# Patient Record
Sex: Female | Born: 1965 | Race: Black or African American | Hispanic: No | Marital: Single | Smoking: Former smoker
Health system: Southern US, Community
[De-identification: ages and names within clinical notes are randomized; demographics above are authoritative.]

## PROBLEM LIST (undated history)

## (undated) DIAGNOSIS — I10 Essential (primary) hypertension: Secondary | ICD-10-CM

## (undated) DIAGNOSIS — E785 Hyperlipidemia, unspecified: Secondary | ICD-10-CM

## (undated) DIAGNOSIS — E119 Type 2 diabetes mellitus without complications: Secondary | ICD-10-CM

## (undated) HISTORY — DX: Hyperlipidemia, unspecified: E78.5

## (undated) HISTORY — DX: Essential (primary) hypertension: I10

## (undated) HISTORY — DX: Type 2 diabetes mellitus without complications: E11.9

---

## 2012-03-17 ENCOUNTER — Ambulatory Visit
Admission: RE | Admit: 2012-03-17 | Discharge: 2012-03-17 | Disposition: A | Discharge status: Home / Self Care | Payer: No Typology Code available for payment source | Source: Ambulatory Visit | Attending: Physical Medicine and Rehabilitation | Admitting: Physical Medicine and Rehabilitation

## 2012-03-17 ENCOUNTER — Other Ambulatory Visit: Payer: Self-pay | Admitting: Physical Medicine and Rehabilitation

## 2012-03-17 DIAGNOSIS — Z021 Encounter for pre-employment examination: Secondary | ICD-10-CM

## 2013-02-15 ENCOUNTER — Other Ambulatory Visit (HOSPITAL_COMMUNITY): Payer: Self-pay | Admitting: Internal Medicine

## 2013-02-15 DIAGNOSIS — Z139 Encounter for screening, unspecified: Secondary | ICD-10-CM

## 2013-02-17 ENCOUNTER — Ambulatory Visit (HOSPITAL_COMMUNITY)
Admission: RE | Admit: 2013-02-17 | Discharge: 2013-02-17 | Disposition: A | Discharge status: Home / Self Care | Payer: BC Managed Care – PPO | Source: Ambulatory Visit | Attending: Internal Medicine | Admitting: Internal Medicine

## 2013-02-17 DIAGNOSIS — Z139 Encounter for screening, unspecified: Secondary | ICD-10-CM

## 2013-02-17 DIAGNOSIS — Z1231 Encounter for screening mammogram for malignant neoplasm of breast: Secondary | ICD-10-CM | POA: Insufficient documentation

## 2014-05-08 ENCOUNTER — Other Ambulatory Visit (HOSPITAL_COMMUNITY): Payer: Self-pay | Admitting: Internal Medicine

## 2014-05-08 DIAGNOSIS — Z139 Encounter for screening, unspecified: Secondary | ICD-10-CM

## 2014-05-15 ENCOUNTER — Ambulatory Visit (HOSPITAL_COMMUNITY)
Admission: RE | Admit: 2014-05-15 | Discharge: 2014-05-15 | Disposition: A | Discharge status: Home / Self Care | Payer: 59 | Source: Ambulatory Visit | Attending: Internal Medicine | Admitting: Internal Medicine

## 2014-05-15 DIAGNOSIS — Z139 Encounter for screening, unspecified: Secondary | ICD-10-CM

## 2014-05-15 DIAGNOSIS — Z1231 Encounter for screening mammogram for malignant neoplasm of breast: Secondary | ICD-10-CM | POA: Diagnosis not present

## 2015-05-30 ENCOUNTER — Other Ambulatory Visit (HOSPITAL_COMMUNITY): Payer: Self-pay | Admitting: Internal Medicine

## 2015-05-30 DIAGNOSIS — Z1231 Encounter for screening mammogram for malignant neoplasm of breast: Secondary | ICD-10-CM

## 2015-06-21 ENCOUNTER — Ambulatory Visit (HOSPITAL_COMMUNITY)
Admission: RE | Admit: 2015-06-21 | Discharge: 2015-06-21 | Disposition: A | Discharge status: Home / Self Care | Payer: 59 | Source: Ambulatory Visit | Attending: Internal Medicine | Admitting: Internal Medicine

## 2015-06-21 DIAGNOSIS — Z1231 Encounter for screening mammogram for malignant neoplasm of breast: Secondary | ICD-10-CM | POA: Diagnosis not present

## 2016-06-10 ENCOUNTER — Other Ambulatory Visit (HOSPITAL_COMMUNITY): Payer: Self-pay | Admitting: Internal Medicine

## 2016-06-10 DIAGNOSIS — Z1231 Encounter for screening mammogram for malignant neoplasm of breast: Secondary | ICD-10-CM

## 2016-07-09 ENCOUNTER — Ambulatory Visit (HOSPITAL_COMMUNITY)
Admission: RE | Admit: 2016-07-09 | Discharge: 2016-07-09 | Disposition: A | Discharge status: Home / Self Care | Payer: 59 | Source: Ambulatory Visit | Attending: Internal Medicine | Admitting: Internal Medicine

## 2016-07-09 DIAGNOSIS — Z1231 Encounter for screening mammogram for malignant neoplasm of breast: Secondary | ICD-10-CM | POA: Insufficient documentation

## 2017-06-09 ENCOUNTER — Other Ambulatory Visit (HOSPITAL_COMMUNITY): Payer: Self-pay | Admitting: Internal Medicine

## 2017-06-09 DIAGNOSIS — Z1231 Encounter for screening mammogram for malignant neoplasm of breast: Secondary | ICD-10-CM

## 2017-07-16 ENCOUNTER — Encounter (HOSPITAL_COMMUNITY): Payer: Self-pay

## 2017-07-16 ENCOUNTER — Ambulatory Visit (HOSPITAL_COMMUNITY)
Admission: RE | Admit: 2017-07-16 | Discharge: 2017-07-16 | Disposition: A | Discharge status: Home / Self Care | Payer: 59 | Source: Ambulatory Visit | Attending: Internal Medicine | Admitting: Internal Medicine

## 2017-07-16 DIAGNOSIS — Z1231 Encounter for screening mammogram for malignant neoplasm of breast: Secondary | ICD-10-CM

## 2018-06-15 ENCOUNTER — Other Ambulatory Visit (HOSPITAL_COMMUNITY): Payer: Self-pay | Admitting: Internal Medicine

## 2018-06-15 DIAGNOSIS — Z1231 Encounter for screening mammogram for malignant neoplasm of breast: Secondary | ICD-10-CM

## 2018-06-17 DIAGNOSIS — Z124 Encounter for screening for malignant neoplasm of cervix: Secondary | ICD-10-CM | POA: Diagnosis not present

## 2018-06-17 DIAGNOSIS — L853 Xerosis cutis: Secondary | ICD-10-CM | POA: Diagnosis not present

## 2018-06-17 DIAGNOSIS — Z0000 Encounter for general adult medical examination without abnormal findings: Secondary | ICD-10-CM | POA: Diagnosis not present

## 2018-06-17 DIAGNOSIS — I10 Essential (primary) hypertension: Secondary | ICD-10-CM | POA: Diagnosis not present

## 2018-06-17 DIAGNOSIS — Z0001 Encounter for general adult medical examination with abnormal findings: Secondary | ICD-10-CM | POA: Diagnosis not present

## 2018-06-24 DIAGNOSIS — E781 Pure hyperglyceridemia: Secondary | ICD-10-CM | POA: Diagnosis not present

## 2018-06-24 DIAGNOSIS — L853 Xerosis cutis: Secondary | ICD-10-CM | POA: Diagnosis not present

## 2018-06-24 DIAGNOSIS — R7301 Impaired fasting glucose: Secondary | ICD-10-CM | POA: Diagnosis not present

## 2018-07-23 ENCOUNTER — Ambulatory Visit (HOSPITAL_COMMUNITY): Payer: 59

## 2018-07-28 ENCOUNTER — Other Ambulatory Visit (HOSPITAL_COMMUNITY): Payer: Self-pay | Admitting: Internal Medicine

## 2018-07-28 ENCOUNTER — Encounter (HOSPITAL_COMMUNITY): Payer: Self-pay | Admitting: Radiology

## 2018-07-28 ENCOUNTER — Ambulatory Visit (HOSPITAL_COMMUNITY)
Admission: RE | Admit: 2018-07-28 | Discharge: 2018-07-28 | Disposition: A | Discharge status: Home / Self Care | Payer: 59 | Source: Ambulatory Visit | Attending: Internal Medicine | Admitting: Internal Medicine

## 2018-07-28 DIAGNOSIS — Z1231 Encounter for screening mammogram for malignant neoplasm of breast: Secondary | ICD-10-CM

## 2018-07-28 DIAGNOSIS — R928 Other abnormal and inconclusive findings on diagnostic imaging of breast: Secondary | ICD-10-CM

## 2018-08-03 ENCOUNTER — Ambulatory Visit (HOSPITAL_COMMUNITY)
Admission: RE | Admit: 2018-08-03 | Discharge: 2018-08-03 | Disposition: A | Discharge status: Home / Self Care | Payer: 59 | Source: Ambulatory Visit | Attending: Internal Medicine | Admitting: Internal Medicine

## 2018-08-03 DIAGNOSIS — R922 Inconclusive mammogram: Secondary | ICD-10-CM | POA: Diagnosis not present

## 2018-08-03 DIAGNOSIS — N6012 Diffuse cystic mastopathy of left breast: Secondary | ICD-10-CM | POA: Diagnosis not present

## 2018-08-03 DIAGNOSIS — R928 Other abnormal and inconclusive findings on diagnostic imaging of breast: Secondary | ICD-10-CM | POA: Diagnosis not present

## 2018-12-17 DIAGNOSIS — R7301 Impaired fasting glucose: Secondary | ICD-10-CM | POA: Diagnosis not present

## 2018-12-17 DIAGNOSIS — I10 Essential (primary) hypertension: Secondary | ICD-10-CM | POA: Diagnosis not present

## 2018-12-23 DIAGNOSIS — I10 Essential (primary) hypertension: Secondary | ICD-10-CM | POA: Diagnosis not present

## 2018-12-23 DIAGNOSIS — R7301 Impaired fasting glucose: Secondary | ICD-10-CM | POA: Diagnosis not present

## 2018-12-23 DIAGNOSIS — E782 Mixed hyperlipidemia: Secondary | ICD-10-CM | POA: Diagnosis not present

## 2019-09-13 ENCOUNTER — Other Ambulatory Visit (HOSPITAL_COMMUNITY): Payer: Self-pay | Admitting: Internal Medicine

## 2019-09-13 DIAGNOSIS — Z1231 Encounter for screening mammogram for malignant neoplasm of breast: Secondary | ICD-10-CM

## 2019-09-26 ENCOUNTER — Other Ambulatory Visit: Payer: Self-pay

## 2019-09-26 ENCOUNTER — Ambulatory Visit (HOSPITAL_COMMUNITY)
Admission: RE | Admit: 2019-09-26 | Discharge: 2019-09-26 | Disposition: A | Discharge status: Home / Self Care | Payer: 59 | Source: Ambulatory Visit | Attending: Internal Medicine | Admitting: Internal Medicine

## 2019-09-26 DIAGNOSIS — Z1231 Encounter for screening mammogram for malignant neoplasm of breast: Secondary | ICD-10-CM

## 2020-07-11 IMAGING — MG DIGITAL SCREENING BILAT W/ TOMO W/ CAD
5 series · 6 of 17 positions shown · non-contrast
Comparison: Previous exam(s).

CLINICAL DATA: Screening.

EXAM:
DIGITAL SCREENING BILATERAL MAMMOGRAM WITH TOMO AND CAD

[R CC synth-2D]
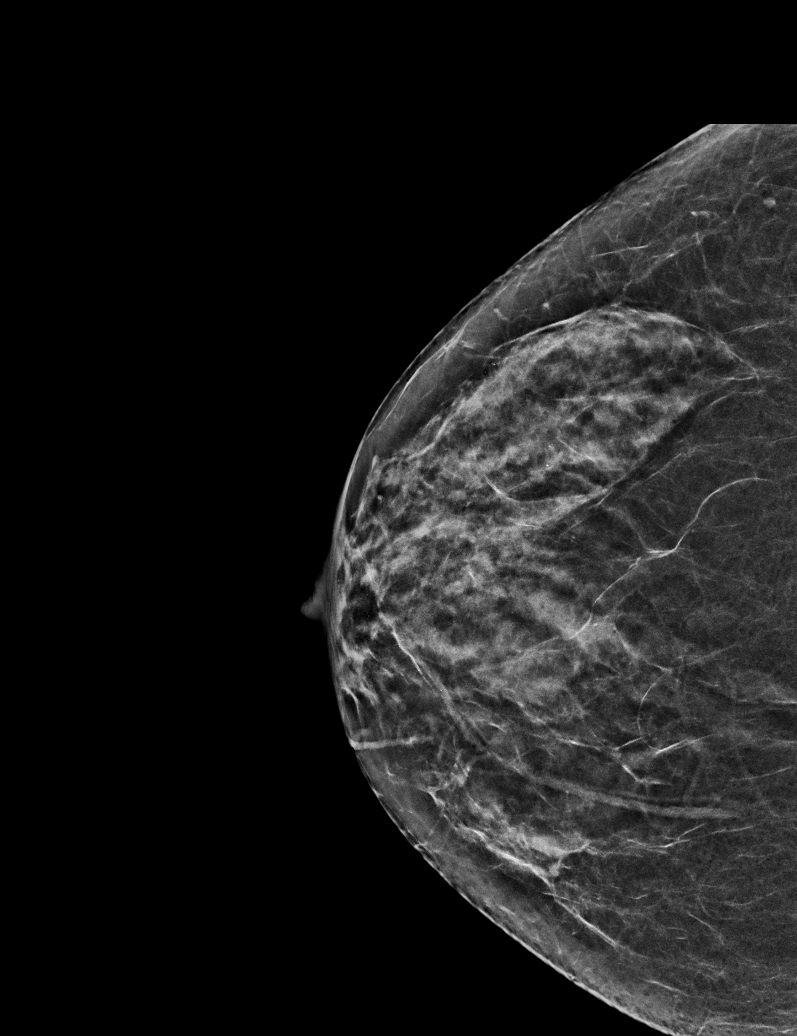

[L CC synth-2D]
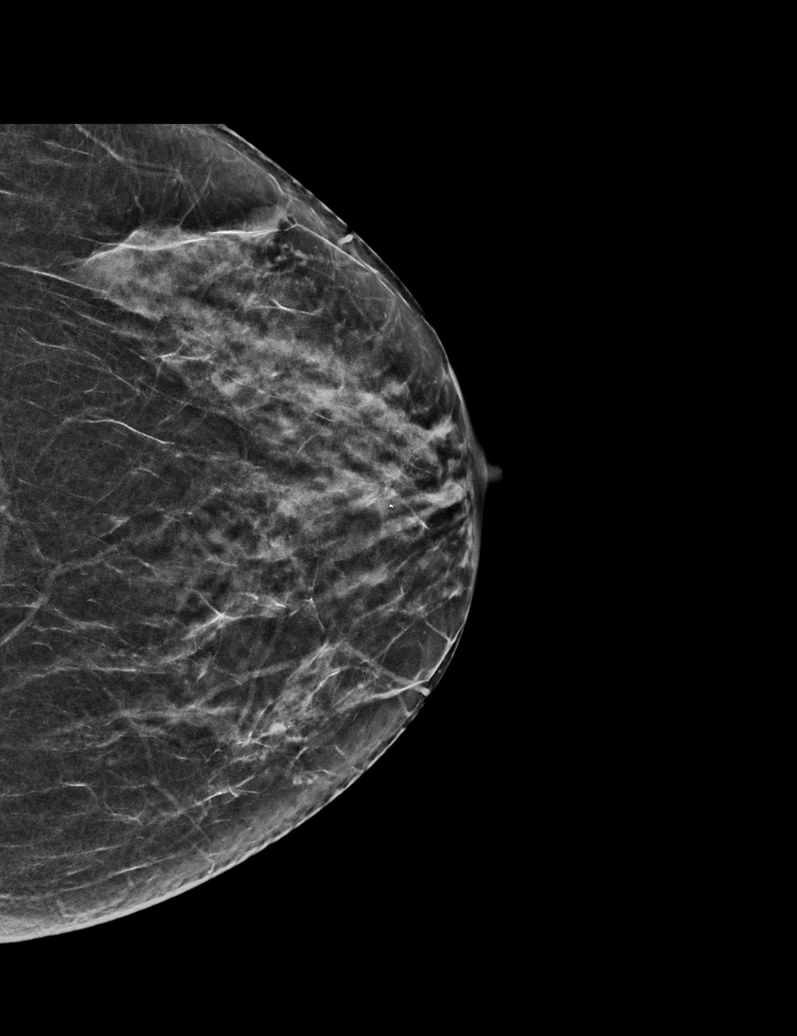

[R CC tomo · 2 of 53 frames shown]
[frame 18/53]
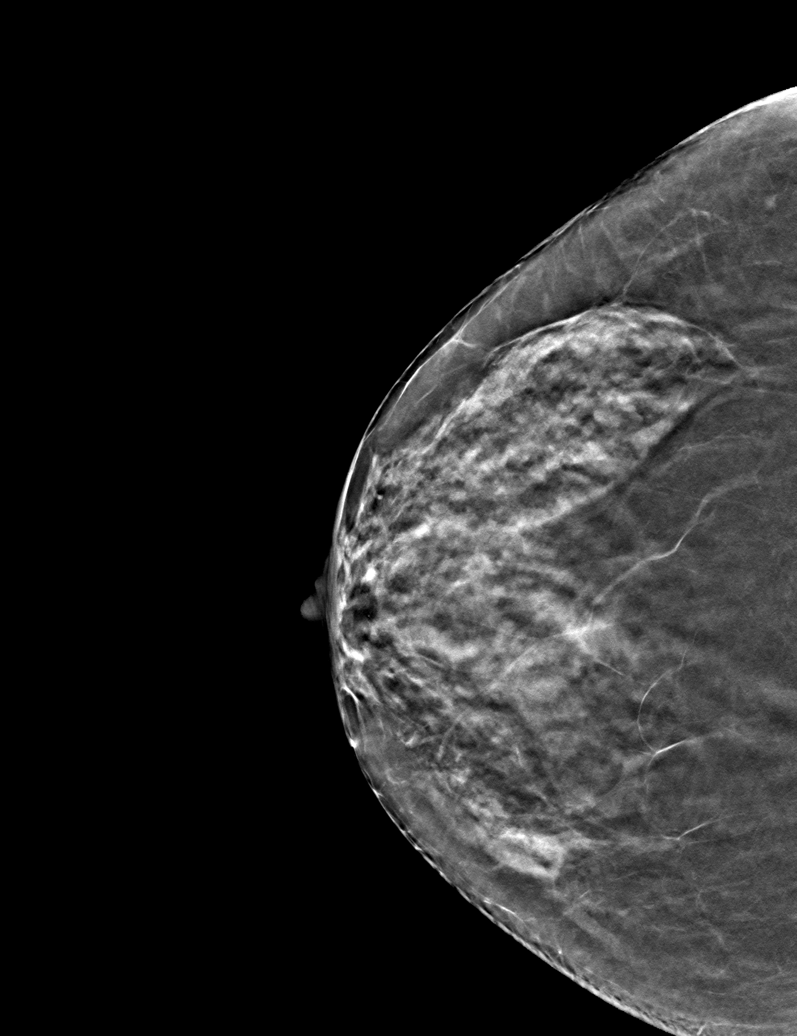
[frame 27/53]
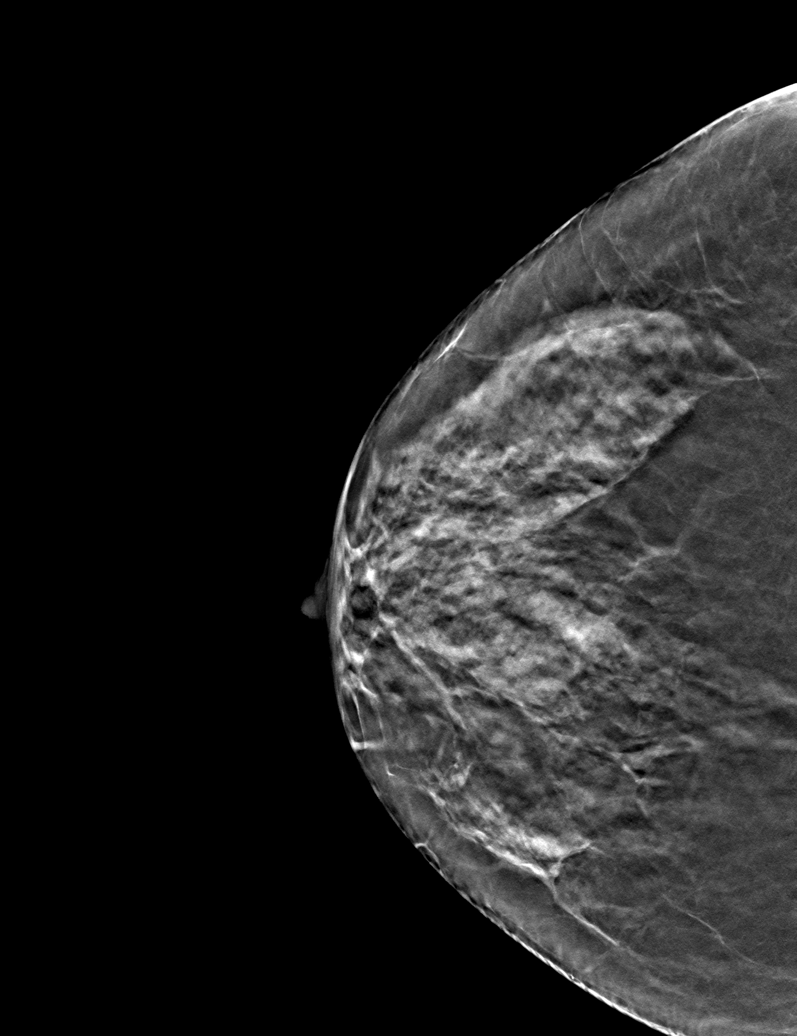

[L CC tomo · tomo slice 29/56.0]
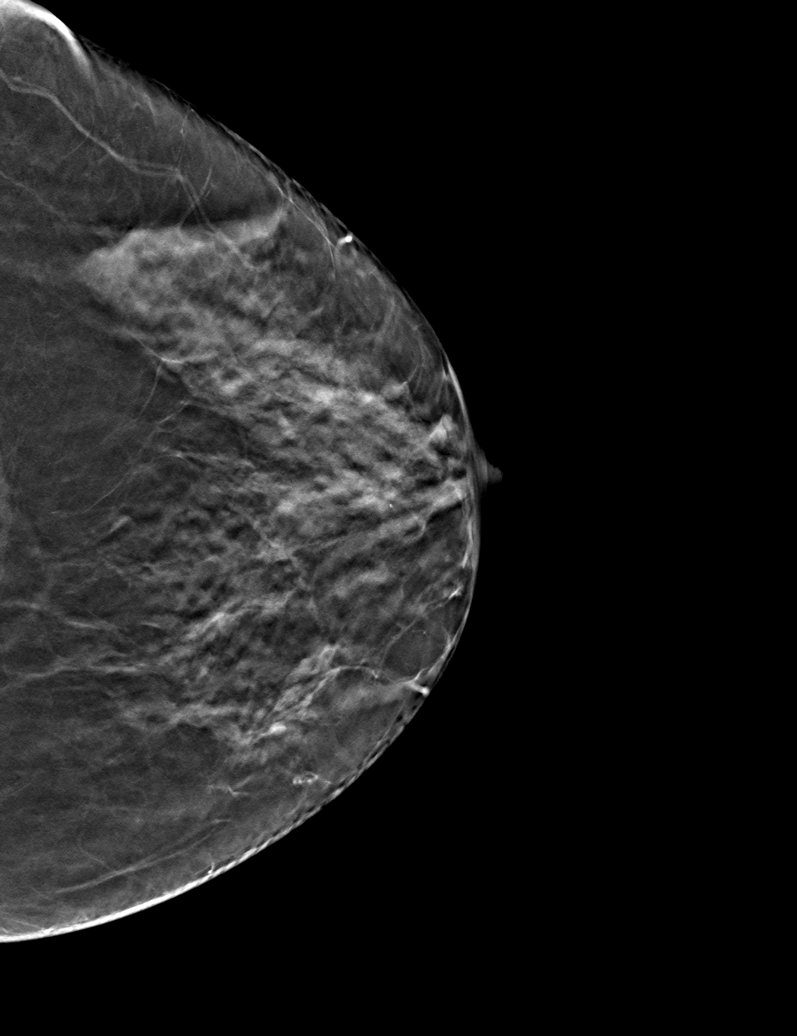

[R MLO tomo · tomo slice 31/61.0]
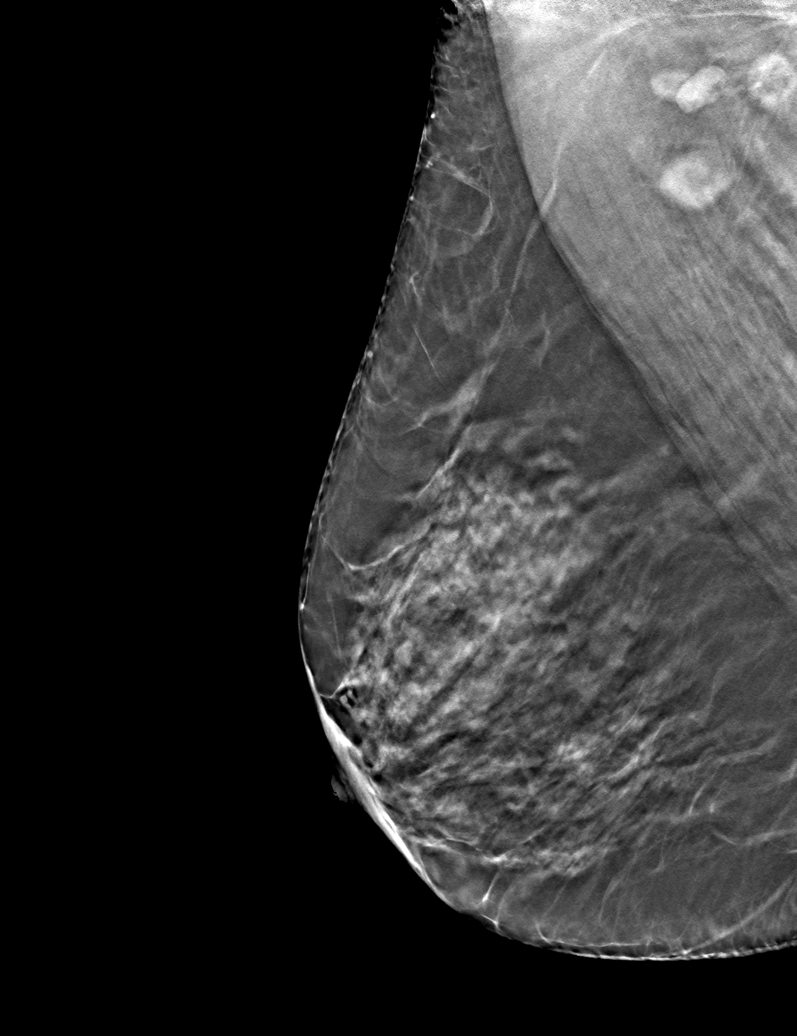

[6 of 17 positions shown; findings below may reference images not displayed]

ACR Breast Density Category c: The breast tissue is heterogeneously
dense, which may obscure small masses.
FINDINGS: There are no findings suspicious for malignancy. Images were
processed with CAD.
IMPRESSION: No mammographic evidence of malignancy. A result letter of this
screening mammogram will be mailed directly to the patient.

RECOMMENDATION:
Screening mammogram in one year. (Code:[id number])

BI-RADS CATEGORY  1: Negative.

## 2021-02-15 ENCOUNTER — Encounter: Payer: Self-pay | Admitting: Internal Medicine

## 2021-02-15 ENCOUNTER — Other Ambulatory Visit: Payer: Self-pay

## 2021-02-15 ENCOUNTER — Encounter: Payer: Self-pay | Admitting: *Deleted

## 2021-02-15 ENCOUNTER — Encounter (INDEPENDENT_AMBULATORY_CARE_PROVIDER_SITE_OTHER): Payer: Self-pay

## 2021-02-15 ENCOUNTER — Ambulatory Visit: Payer: 59 | Admitting: Internal Medicine

## 2021-02-15 VITALS — BP 126/78 | HR 82 | Ht 65.0 in | Wt 153.0 lb

## 2021-02-15 DIAGNOSIS — R9431 Abnormal electrocardiogram [ECG] [EKG]: Secondary | ICD-10-CM

## 2021-02-15 NOTE — Progress Notes (Signed)
Cardiology Office Note   Date:  02/15/2021   ID:  Traci Harrison, DOB 05-13-1966, MRN [id number]  PCP:  Benita Stabile, MD  Cardiologist:   Dietrich Pates, MD   Pt referred by Hughie Closs for evla of dizziness      History of Present Illness: Traci Harrison is a 55 y.o. female with a history of HTN, HL, Type II DM   Seen by Hughie Closs in May    Complained of some dizzinesss with changes in position  EKG dones   Reported borderline  Referred to cardiology     Since seen she says she has not had any further episodes of dizziness   She is active   On her feet moving      Denies CP   No SOB       Pt has had HTN for about 6 years    Started first on lisinopril   Amlodipine added     Diet:  Morning :  eggs/bacon/grits    Skips 2x per week Lunch   sandwich or chicken/veg   Dinner   Same as lunch    Snacks:  Pickles, skins    Drink  Water        Current Meds  Medication Sig   amLODipine (NORVASC) 5 MG tablet amlodipine 5 mg tablet  Take 1 tablet every day by oral route.   lisinopril-hydrochlorothiazide (ZESTORETIC) 20-25 MG tablet lisinopril 20 mg-hydrochlorothiazide 25 mg tablet  TAKE ONE TABLET BY MOUTH DAILY.   triamcinolone (KENALOG) 0.025 % cream triamcinolone acetonide 0.025 % topical cream     Allergies:   Patient has no known allergies.   Past Medical History:  Diagnosis Date   Diabetes mellitus without complication (HCC)    Hyperlipidemia    Hypertension     History reviewed. No pertinent surgical history.   Social History:  The patient  reports that she quit smoking yesterday. Her smoking use included cigarettes. She started smoking about 5 years ago. She has a 2.50 pack-year smoking history. She does not have any smokeless tobacco history on file. She reports current alcohol use of about 12.0 standard drinks of alcohol per week. She reports current drug use. Drug: Marijuana.   Family History:  The patient's family history includes Cancer in her father; Diabetes in  her mother; Heart attack in her mother.    ROS:  Please see the history of present illness. All other systems are reviewed and  Negative to the above problem except as noted.    PHYSICAL EXAM: VS:  BP 126/78 (BP Location: Right Arm)   Pulse 82   Ht 5\' 5"  (1.651 m)   Wt 153 lb (69.4 kg)   SpO2 98%   BMI 25.46 kg/m   GEN: Well nourished, well developed, in no acute distress  HEENT: normal  Neck: no JVD, carotid bruits, or masses Cardiac: RRR; no murmurs; no LE  edema  Respiratory:  clear to auscultation bilaterally, GI: soft, nontender, nondistended, + BS  No hepatomegaly  MS: no deformity Moving all extremities   Skin: warm and dry, no rash Neuro:  Strength and sensation are intact Psych: euthymic mood, full affect   EKG:  EKG is ordered today.NSR   82 bpm      Lipid Panel No results found for: CHOL, TRIG, HDL, CHOLHDL, VLDL, LDLCALC, LDLDIRECT    Wt Readings from Last 3 Encounters:  02/15/21 153 lb (69.4 kg)      ASSESSMENT AND PLAN:  1  Dizziness  Resolved   No furter spells  2  HTN   Controlled    Follow at home   3   Metabolic syncdrome A1C 6.6  Discussed diet   WaTch carbs/sweets  Try TRE  1 or 2 meals per day   Pt eager to try  4   Dyslipidemia  Trigs elevated  222  HDL 45  LDL 89  Check next spring   5  EKG  Nonspecific changes   Follow  Stay active    Follow up next spring with labs      Current medicines are reviewed at length with the patient today.  The patient does not have concerns regarding medicines.  Signed, Dietrich Pates, MD  02/15/2021 9:19 AM    [facility] [address] [place], [place], [place]  [address] Phone: [phone]; Fax: [phone]

## 2021-02-15 NOTE — Patient Instructions (Signed)
Medication Instructions:  Your physician recommends that you continue on your current medications as directed. Please refer to the Current Medication list given to you today.  *If you need a refill on your cardiac medications before your next appointment, please call your pharmacy*   Lab Work:  NONE  If you have labs (blood work) drawn today and your tests are completely normal, you will receive your results only by: MyChart Message (if you have MyChart) OR A paper copy in the mail If you have any lab test that is abnormal or we need to change your treatment, we will call you to review the results.   Testing/Procedures: NONE    Follow-Up: At [facility], you and your health needs are our priority.  As part of our continuing mission to provide you with exceptional heart care, we have created designated Provider Care Teams.  These Care Teams include your primary Cardiologist (physician) and Advanced Practice Providers (APPs -  Physician Assistants and Nurse Practitioners) who all work together to provide you with the care you need, when you need it.  We recommend signing up for the patient portal called "MyChart".  Sign up information is provided on this After Visit Summary.  MyChart is used to connect with patients for Virtual Visits (Telemedicine).  Patients are able to view lab/test results, encounter notes, upcoming appointments, etc.  Non-urgent messages can be sent to your provider as well.   To learn more about what you can do with MyChart, go to [email or web address].    Your next appointment:    Spring 2023  The format for your next appointment:   In Person  Provider:   Dietrich Pates, MD   Other Instructions Thank you for choosing [facility]!

## 2021-06-06 ENCOUNTER — Encounter (INDEPENDENT_AMBULATORY_CARE_PROVIDER_SITE_OTHER): Payer: Self-pay | Admitting: *Deleted

## 2021-11-11 ENCOUNTER — Encounter (INDEPENDENT_AMBULATORY_CARE_PROVIDER_SITE_OTHER): Payer: Self-pay | Admitting: *Deleted

## 2021-11-21 ENCOUNTER — Ambulatory Visit: Payer: 59 | Admitting: Nutrition

## 2021-12-30 ENCOUNTER — Encounter: Payer: 59 | Attending: Family Medicine | Admitting: Nutrition

## 2021-12-30 ENCOUNTER — Encounter: Payer: Self-pay | Admitting: Nutrition

## 2021-12-30 VITALS — Ht 65.0 in | Wt 133.4 lb

## 2021-12-30 DIAGNOSIS — E119 Type 2 diabetes mellitus without complications: Secondary | ICD-10-CM | POA: Diagnosis present

## 2021-12-30 NOTE — Patient Instructions (Signed)
Goals  Eat three meals per day at the times discussed based on which shift Don't skip meals Focus on Plant based foods Eat 3 carb choices per meal. Drink only water  Avoid processed foods Choose high fiber foods. Gain 1/2-1 lb per week to baseline of back to 150's.

## 2021-12-30 NOTE — Progress Notes (Signed)
Medical Nutrition Therapy  Appointment Start time:  0800  Appointment End time:  0900  Primary concerns today: Dm Type 2 and weight loss  Referral diagnosis: E11.8, unplanned weight loss. Preferred learning style: no preference. Learning readiness: Ready (not ready, contemplating, ready, change in progress)   NUTRITION ASSESSMENT  "I can't stop losing weight. I wanted to lose some weight but now it's gone down too much." She notes she sometimes has no appetite. Prefers to weigh back to 150's. UBW 180's  2021. A1C at diagnosis was 6.6%. 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  She notes she needs to do better with planning 3 meals per day. Skips meals and was doing some intermittent fasting to lose weight initially.  She is willing to work on Smoot and eating more consistent balanced meals depending on what shift she is working.  She is not taking any medications for her DM. She is working changing her lifestyle. Not testing blood sugars. Not necessarily needed with A1C 6.3%  Anthropometrics  Wt Readings from Last 3 Encounters:  12/30/21 133 lb 6.4 oz (60.5 kg)  02/15/21 153 lb (69.4 kg)   Ht Readings from Last 3 Encounters:  12/30/21 5\' 5"  (1.651 m)  02/15/21 5\' 5"  (1.651 m)   Body mass index is 22.2 kg/m. @BMIFA @ Facility age limit for growth percentiles is 20 years. Facility age limit for growth percentiles is 20 years.    Clinical Medical Hx: Dm Type 2,  Medications: See chart Labs: No results found for: HGBA1C  Notable Signs/Symptoms: Weakness, headaches, heart racing, signs of low blood sugars  Lifestyle & Dietary Hx LIves by herself.  Estimated daily fluid intake: 80 oz Supplements:  Sleep: good  Stress / self-care: none Current average weekly physical activity: Is active on her job.  24-Hr Dietary Recall First Meal:  830 Boiled egg, bacon, egg and toast, water Snack:  Second Meal: Green beans, baked chicken,  water Snack:  Third Meal: skipped Snack:  Beverages: water,   Estimated Energy Needs Calories: 1600-1800 Carbohydrate: 200g Protein: 135g Fat: 50g   NUTRITION DIAGNOSIS  NB-1.1 Food and nutrition-related knowledge deficit As related to Diabetes.  As evidenced by A1C 6.6%.   NUTRITION INTERVENTION  Nutrition education (E-1) on the following topics:  Nutrition and Diabetes education provided on My Plate, CHO counting, meal planning, portion sizes, timing of meals, avoiding snacks between meals unless having a low blood sugar, target ranges for A1C and blood sugars, signs/symptoms and treatment of hyper/hypoglycemia, monitoring blood sugars, taking medications as prescribed, benefits of exercising 30 minutes per day and prevention of complications of DM.  Lifestyle Medicine  - Whole Food, Plant Predominant Nutrition is highly recommended: Eat Plenty of vegetables, Mushrooms, fruits, Legumes, Whole Grains, Nuts, seeds in lieu of processed meats, processed snacks/pastries red meat, poultry, eggs.    -It is better to avoid simple carbohydrates including: Cakes, Sweet Desserts, Ice Cream, Soda (diet and regular), Sweet Tea, Candies, Chips, Cookies, Store Bought Juices, Alcohol in Excess of  1-2 drinks a day, Lemonade,  Artificial Sweeteners, Doughnuts, Coffee Creamers, "Sugar-free" Products, etc, etc.  This is not a complete list.....  Exercise: If you are able: 30 -60 minutes a day ,4 days a week, or 150 minutes a week.  The longer the better.  Combine stretch, strength, and aerobic activities.  If you were told in the past that you have high risk for cardiovascular diseases, you may seek evaluation by your heart  doctor prior to initiating moderate to intense exercise programs.   Handouts Provided Include  Lifestyle Medicine [email or web address] handout  Learning Style & Readiness for Change Teaching method utilized: Visual & Auditory  Demonstrated degree of understanding via: Teach Back   Barriers to learning/adherence to lifestyle change: none  Goals Established by Pt Goals  Eat three meals per day at the times discussed based on which shift Don't skip meals Focus on Plant based foods Eat 3 carb choices per meal. Drink only water  Avoid processed foods Choose high fiber foods. Gain 1/2-1 lb per week to baseline of back to 150's.   MONITORING & EVALUATION Dietary intake, weekly physical activity, and meal planning in 2-3 months.  Next Steps  Patient is to work on eating consistent balanced meals.Marland Kitchen

## 2022-02-03 ENCOUNTER — Ambulatory Visit: Payer: 59 | Admitting: Nutrition

## 2022-09-29 DIAGNOSIS — R7989 Other specified abnormal findings of blood chemistry: Secondary | ICD-10-CM | POA: Diagnosis not present

## 2022-09-29 DIAGNOSIS — I10 Essential (primary) hypertension: Secondary | ICD-10-CM | POA: Diagnosis not present

## 2022-09-29 DIAGNOSIS — R7303 Prediabetes: Secondary | ICD-10-CM | POA: Diagnosis not present

## 2022-09-29 DIAGNOSIS — E7800 Pure hypercholesterolemia, unspecified: Secondary | ICD-10-CM | POA: Diagnosis not present

## 2022-10-21 DIAGNOSIS — R7303 Prediabetes: Secondary | ICD-10-CM | POA: Diagnosis not present

## 2022-10-21 DIAGNOSIS — E7800 Pure hypercholesterolemia, unspecified: Secondary | ICD-10-CM | POA: Diagnosis not present

## 2022-10-21 DIAGNOSIS — R7989 Other specified abnormal findings of blood chemistry: Secondary | ICD-10-CM | POA: Diagnosis not present

## 2022-10-21 DIAGNOSIS — I10 Essential (primary) hypertension: Secondary | ICD-10-CM | POA: Diagnosis not present

## 2022-10-28 DIAGNOSIS — E7800 Pure hypercholesterolemia, unspecified: Secondary | ICD-10-CM | POA: Diagnosis not present

## 2022-10-28 DIAGNOSIS — Z0000 Encounter for general adult medical examination without abnormal findings: Secondary | ICD-10-CM | POA: Diagnosis not present

## 2022-10-28 DIAGNOSIS — I10 Essential (primary) hypertension: Secondary | ICD-10-CM | POA: Diagnosis not present

## 2022-10-28 DIAGNOSIS — R7303 Prediabetes: Secondary | ICD-10-CM | POA: Diagnosis not present

## 2022-10-29 ENCOUNTER — Other Ambulatory Visit (HOSPITAL_COMMUNITY): Payer: Self-pay

## 2022-10-29 DIAGNOSIS — Z1231 Encounter for screening mammogram for malignant neoplasm of breast: Secondary | ICD-10-CM

## 2022-11-03 ENCOUNTER — Ambulatory Visit (HOSPITAL_COMMUNITY)
Admission: RE | Admit: 2022-11-03 | Discharge: 2022-11-03 | Disposition: A | Discharge status: Home / Self Care | Payer: BC Managed Care – PPO | Source: Ambulatory Visit

## 2022-11-03 DIAGNOSIS — Z1231 Encounter for screening mammogram for malignant neoplasm of breast: Secondary | ICD-10-CM | POA: Diagnosis not present

## 2022-12-30 DIAGNOSIS — D123 Benign neoplasm of transverse colon: Secondary | ICD-10-CM | POA: Diagnosis not present

## 2022-12-30 DIAGNOSIS — Z1211 Encounter for screening for malignant neoplasm of colon: Secondary | ICD-10-CM | POA: Diagnosis not present

## 2022-12-30 DIAGNOSIS — D122 Benign neoplasm of ascending colon: Secondary | ICD-10-CM | POA: Diagnosis not present

## 2023-06-29 DIAGNOSIS — H5213 Myopia, bilateral: Secondary | ICD-10-CM | POA: Diagnosis not present

## 2023-08-26 DIAGNOSIS — H5213 Myopia, bilateral: Secondary | ICD-10-CM | POA: Diagnosis not present

## 2023-10-28 ENCOUNTER — Encounter: Payer: Self-pay | Admitting: Internal Medicine

## 2023-12-01 ENCOUNTER — Telehealth: Payer: Self-pay | Admitting: Internal Medicine

## 2023-12-01 NOTE — Telephone Encounter (Signed)
 Deleting recall- unable to contact pt to sch after multiple attempts
